# Patient Record
Sex: Female | Born: 2000 | Race: Black or African American | Hispanic: No | Marital: Single | Smoking: Never smoker
Health system: Southern US, Community
[De-identification: ages and names within clinical notes are randomized; demographics above are authoritative.]

## PROBLEM LIST (undated history)

## (undated) DIAGNOSIS — R519 Headache, unspecified: Secondary | ICD-10-CM

## (undated) DIAGNOSIS — R55 Syncope and collapse: Secondary | ICD-10-CM

## (undated) DIAGNOSIS — R51 Headache: Secondary | ICD-10-CM

## (undated) DIAGNOSIS — T783XXA Angioneurotic edema, initial encounter: Secondary | ICD-10-CM

## (undated) HISTORY — DX: Syncope and collapse: R55

## (undated) HISTORY — DX: Headache: R51

## (undated) HISTORY — DX: Headache, unspecified: R51.9

## (undated) HISTORY — PX: NO PAST SURGERIES: SHX2092

## (undated) HISTORY — DX: Angioneurotic edema, initial encounter: T78.3XXA

---
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
## 2022-07-29 DIAGNOSIS — Z3201 Encounter for pregnancy test, result positive: Secondary | ICD-10-CM | POA: Diagnosis not present

## 2022-07-29 DIAGNOSIS — Z8759 Personal history of other complications of pregnancy, childbirth and the puerperium: Secondary | ICD-10-CM | POA: Diagnosis not present

## 2022-07-31 DIAGNOSIS — Z3491 Encounter for supervision of normal pregnancy, unspecified, first trimester: Secondary | ICD-10-CM | POA: Diagnosis not present

## 2022-08-05 DIAGNOSIS — Z3491 Encounter for supervision of normal pregnancy, unspecified, first trimester: Secondary | ICD-10-CM | POA: Diagnosis not present

## 2022-08-05 DIAGNOSIS — Z3A12 12 weeks gestation of pregnancy: Secondary | ICD-10-CM | POA: Diagnosis not present

## 2022-09-02 DIAGNOSIS — Z3492 Encounter for supervision of normal pregnancy, unspecified, second trimester: Secondary | ICD-10-CM | POA: Diagnosis not present

## 2022-09-02 DIAGNOSIS — Z3A16 16 weeks gestation of pregnancy: Secondary | ICD-10-CM | POA: Diagnosis not present

## 2022-09-05 IMAGING — CR DG TOE 2ND 2+V*R*
3 series · 3 of 3 positions shown · non-contrast
Comparison: None.

CLINICAL DATA: Nodule along the plantar aspect of the right second
toe

EXAM:
RIGHT SECOND TOE

[toe ap]
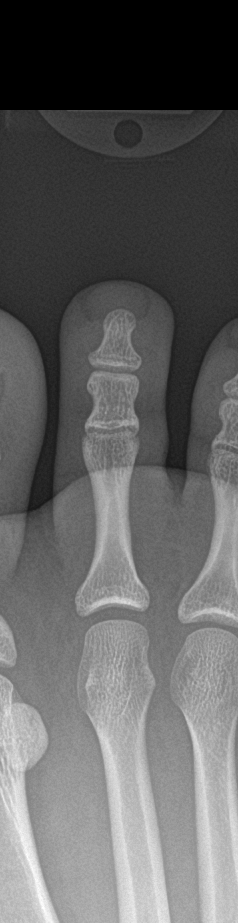

[toe obl]
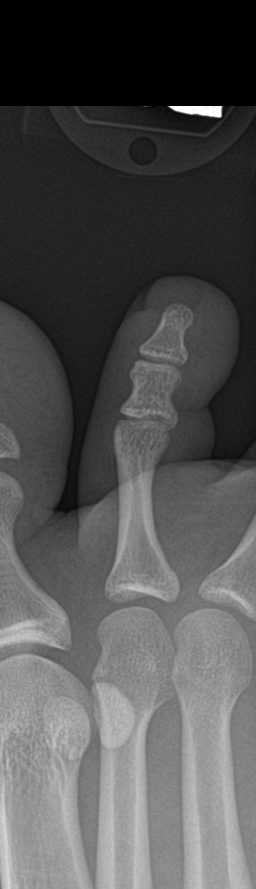

[toe lat]
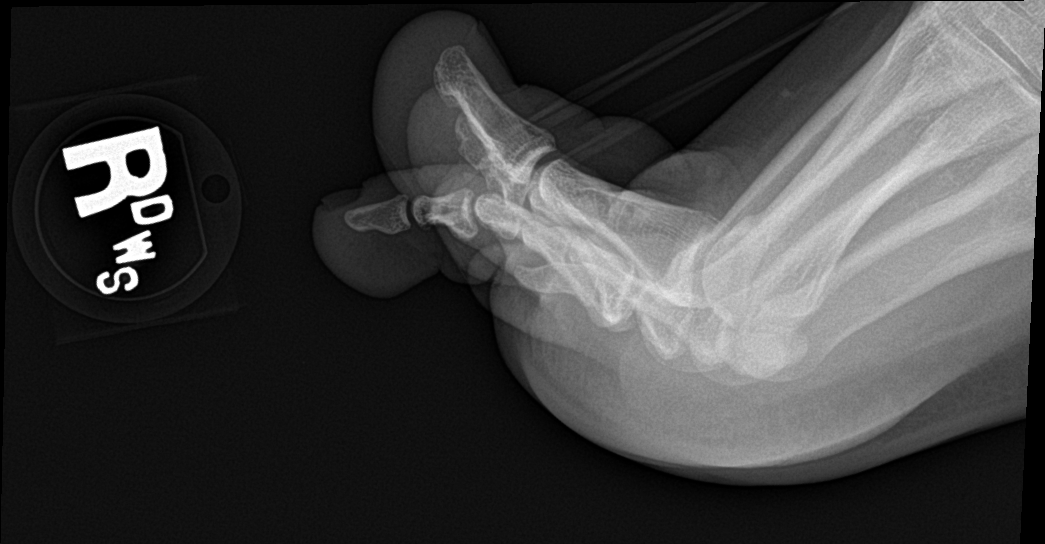

[3 of 3 positions shown; findings below may reference images not displayed]

FINDINGS: There is no evidence of fracture or dislocation. There is no
evidence of arthropathy or other focal bone abnormality. Soft
tissues are unremarkable.
IMPRESSION: Negative.

## 2022-09-30 DIAGNOSIS — Z3A20 20 weeks gestation of pregnancy: Secondary | ICD-10-CM | POA: Diagnosis not present

## 2022-09-30 DIAGNOSIS — O34219 Maternal care for unspecified type scar from previous cesarean delivery: Secondary | ICD-10-CM | POA: Diagnosis not present

## 2022-09-30 DIAGNOSIS — Z3689 Encounter for other specified antenatal screening: Secondary | ICD-10-CM | POA: Diagnosis not present

## 2022-09-30 DIAGNOSIS — Z8759 Personal history of other complications of pregnancy, childbirth and the puerperium: Secondary | ICD-10-CM | POA: Diagnosis not present

## 2022-11-05 DIAGNOSIS — Z3A26 26 weeks gestation of pregnancy: Secondary | ICD-10-CM | POA: Diagnosis not present

## 2022-11-05 DIAGNOSIS — O34211 Maternal care for low transverse scar from previous cesarean delivery: Secondary | ICD-10-CM | POA: Diagnosis not present

## 2022-11-05 DIAGNOSIS — O2612 Low weight gain in pregnancy, second trimester: Secondary | ICD-10-CM | POA: Diagnosis not present

## 2022-11-05 DIAGNOSIS — O09292 Supervision of pregnancy with other poor reproductive or obstetric history, second trimester: Secondary | ICD-10-CM | POA: Diagnosis not present

## 2022-11-20 DIAGNOSIS — Z362 Encounter for other antenatal screening follow-up: Secondary | ICD-10-CM | POA: Diagnosis not present

## 2022-11-20 DIAGNOSIS — Z3201 Encounter for pregnancy test, result positive: Secondary | ICD-10-CM | POA: Diagnosis not present

## 2022-11-20 DIAGNOSIS — Z3483 Encounter for supervision of other normal pregnancy, third trimester: Secondary | ICD-10-CM | POA: Diagnosis not present

## 2022-12-03 DIAGNOSIS — F411 Generalized anxiety disorder: Secondary | ICD-10-CM | POA: Diagnosis not present

## 2022-12-03 DIAGNOSIS — D649 Anemia, unspecified: Secondary | ICD-10-CM | POA: Diagnosis not present

## 2022-12-03 DIAGNOSIS — O09293 Supervision of pregnancy with other poor reproductive or obstetric history, third trimester: Secondary | ICD-10-CM | POA: Diagnosis not present

## 2022-12-03 DIAGNOSIS — O34211 Maternal care for low transverse scar from previous cesarean delivery: Secondary | ICD-10-CM | POA: Diagnosis not present

## 2022-12-03 DIAGNOSIS — Z3A30 30 weeks gestation of pregnancy: Secondary | ICD-10-CM | POA: Diagnosis not present

## 2022-12-03 DIAGNOSIS — O99013 Anemia complicating pregnancy, third trimester: Secondary | ICD-10-CM | POA: Diagnosis not present

## 2022-12-03 DIAGNOSIS — O99343 Other mental disorders complicating pregnancy, third trimester: Secondary | ICD-10-CM | POA: Diagnosis not present

## 2023-01-15 DIAGNOSIS — Z3493 Encounter for supervision of normal pregnancy, unspecified, third trimester: Secondary | ICD-10-CM | POA: Diagnosis not present

## 2023-01-15 DIAGNOSIS — Z3A36 36 weeks gestation of pregnancy: Secondary | ICD-10-CM | POA: Diagnosis not present

## 2023-01-21 DIAGNOSIS — O99013 Anemia complicating pregnancy, third trimester: Secondary | ICD-10-CM | POA: Diagnosis not present

## 2023-01-21 DIAGNOSIS — Z113 Encounter for screening for infections with a predominantly sexual mode of transmission: Secondary | ICD-10-CM | POA: Diagnosis not present

## 2023-02-12 DIAGNOSIS — O1494 Unspecified pre-eclampsia, complicating childbirth: Secondary | ICD-10-CM | POA: Diagnosis not present

## 2023-02-12 DIAGNOSIS — O9902 Anemia complicating childbirth: Secondary | ICD-10-CM | POA: Diagnosis not present

## 2023-02-12 DIAGNOSIS — O34219 Maternal care for unspecified type scar from previous cesarean delivery: Secondary | ICD-10-CM | POA: Diagnosis not present

## 2023-02-12 DIAGNOSIS — D649 Anemia, unspecified: Secondary | ICD-10-CM | POA: Diagnosis not present

## 2023-02-12 DIAGNOSIS — O480 Post-term pregnancy: Secondary | ICD-10-CM | POA: Diagnosis not present

## 2023-02-12 DIAGNOSIS — Z3A40 40 weeks gestation of pregnancy: Secondary | ICD-10-CM | POA: Diagnosis not present

## 2023-02-13 DIAGNOSIS — O34211 Maternal care for low transverse scar from previous cesarean delivery: Secondary | ICD-10-CM | POA: Diagnosis not present

## 2023-02-13 DIAGNOSIS — O1494 Unspecified pre-eclampsia, complicating childbirth: Secondary | ICD-10-CM | POA: Diagnosis not present

## 2023-02-13 DIAGNOSIS — D649 Anemia, unspecified: Secondary | ICD-10-CM | POA: Diagnosis not present

## 2023-02-13 DIAGNOSIS — O4202 Full-term premature rupture of membranes, onset of labor within 24 hours of rupture: Secondary | ICD-10-CM | POA: Diagnosis not present

## 2023-02-13 DIAGNOSIS — O9902 Anemia complicating childbirth: Secondary | ICD-10-CM | POA: Diagnosis not present

## 2023-02-13 DIAGNOSIS — Z3A40 40 weeks gestation of pregnancy: Secondary | ICD-10-CM | POA: Diagnosis not present

## 2023-02-14 DIAGNOSIS — O34211 Maternal care for low transverse scar from previous cesarean delivery: Secondary | ICD-10-CM | POA: Diagnosis not present

## 2023-02-15 DIAGNOSIS — D649 Anemia, unspecified: Secondary | ICD-10-CM | POA: Diagnosis not present

## 2023-02-15 DIAGNOSIS — O9903 Anemia complicating the puerperium: Secondary | ICD-10-CM | POA: Diagnosis not present

## 2023-02-15 DIAGNOSIS — O34211 Maternal care for low transverse scar from previous cesarean delivery: Secondary | ICD-10-CM | POA: Diagnosis not present

## 2023-02-18 DIAGNOSIS — N390 Urinary tract infection, site not specified: Secondary | ICD-10-CM | POA: Diagnosis not present

## 2023-02-18 DIAGNOSIS — R918 Other nonspecific abnormal finding of lung field: Secondary | ICD-10-CM | POA: Diagnosis not present

## 2023-02-19 DIAGNOSIS — R918 Other nonspecific abnormal finding of lung field: Secondary | ICD-10-CM | POA: Diagnosis not present

## 2023-02-21 DIAGNOSIS — O8600 Infection of obstetric surgical wound, unspecified: Secondary | ICD-10-CM | POA: Diagnosis not present

## 2023-02-21 DIAGNOSIS — R000 Tachycardia, unspecified: Secondary | ICD-10-CM | POA: Diagnosis not present

## 2023-02-21 DIAGNOSIS — L02211 Cutaneous abscess of abdominal wall: Secondary | ICD-10-CM | POA: Diagnosis not present

## 2023-02-23 ENCOUNTER — Other Ambulatory Visit: Payer: Self-pay

## 2023-02-23 ENCOUNTER — Emergency Department
Admission: EM | Admit: 2023-02-23 | Discharge: 2023-02-23 | Disposition: A | Discharge status: Home / Self Care | Payer: Medicaid Other | Attending: Emergency Medicine | Admitting: Emergency Medicine

## 2023-02-23 DIAGNOSIS — T8149XA Infection following a procedure, other surgical site, initial encounter: Secondary | ICD-10-CM | POA: Diagnosis not present

## 2023-02-23 DIAGNOSIS — Y838 Other surgical procedures as the cause of abnormal reaction of the patient, or of later complication, without mention of misadventure at the time of the procedure: Secondary | ICD-10-CM | POA: Diagnosis not present

## 2023-02-23 DIAGNOSIS — T8141XA Infection following a procedure, superficial incisional surgical site, initial encounter: Secondary | ICD-10-CM | POA: Insufficient documentation

## 2023-02-23 MED ORDER — MUPIROCIN 2 % EX OINT: 1.0000 | TOPICAL_OINTMENT | Freq: Two times a day (BID) | CUTANEOUS | 0 refills | Status: AC

## 2023-02-23 MED ORDER — CLINDAMYCIN HCL 150 MG PO CAPS: 450.0000 mg | ORAL_CAPSULE | Freq: Three times a day (TID) | ORAL | 0 refills | Status: AC

## 2023-02-23 NOTE — ED Notes (Signed)
See triage notes. Patient c/o an abscess to her C-section incision.

## 2023-02-23 NOTE — ED Triage Notes (Signed)
Pt here with a post op problem. Pt is 9 days post cesarean and has now developed an abscess on her incision. Pt states she was started on an abx as well for a recent UTI. Pt denies any other complaints.

## 2023-02-23 NOTE — ED Provider Notes (Signed)
[facility] Provider Note    Event Date/Time   First MD Initiated Contact with Patient 02/23/23 1201     (approximate)   History   Post-op Problem   HPI Christina Harding is a 22 y.o. female who is 10 days postop from C-section and presents for possible surgical site infection.  She has had an ongoing process over the last week where she has seen a couple of different providers in [place] with concerns for infection.  Most recently she was seen 2 days ago at an emergency department around [facility] where she and her mother who is at bedside state that she had an ultrasound which showed an abscess at the surgical site and the doctor was able to express a substantial amount of pus from the wound.  She was prescribed clindamycin and a topical antibiotic (and presumably mupirocin).  However, the prescriptions were sent electronically along with some Norco (which she said she did not even want), and the pharmacy was not able to fill the prescription over the weekend.  She is here because she is concerned that she is going too long without the antibiotics and she continues to have some drainage from the site.  No fever, no nausea, no vomiting.  She is breast-feeding.     Physical Exam   Triage Vital Signs: ED Triage Vitals  Encounter Vitals Group     BP 02/23/23 1151 112/71     Systolic BP Percentile --      Diastolic BP Percentile --      Pulse Rate 02/23/23 1151 69     Resp 02/23/23 1151 17     Temp 02/23/23 1151 97.9 F (36.6 C)     Temp Source 02/23/23 1151 Oral     SpO2 02/23/23 1151 96 %     Weight 02/23/23 1154 68.9 kg (151 lb 14.4 oz)     Height 02/23/23 1154 1.524 m (5')     Head Circumference --      Peak Flow --      Pain Score 02/23/23 1151 4     Pain Loc --      Pain Education --      Exclude from Growth Chart --     Most recent vital signs: Vitals:   02/23/23 1151  BP: 112/71  Pulse: 69  Resp: 17  Temp: 97.9 F (36.6 C)  SpO2: 96%     General: Awake, no distress.  Very well-appearing despite history of present illness. CV:  Good peripheral perfusion.  Regular rate and rhythm. Resp:  Normal effort. Speaking easily and comfortably, no accessory muscle usage nor intercostal retractions.   Abd:  No distention.  No tenderness to palpation of the abdomen.  Surprisingly well-appearing lower abdominal wound consistent with history of C-section.  No wound dehiscence and no discharge at this time.  There is 1 small area that appears raw, but it is not actively expressing fluid or pus.  There is induration along the length of the incision but no palpable fluctuance and no significant tenderness to palpation.   ED Results / Procedures / Treatments   Labs (all labs ordered are listed, but only abnormal results are displayed) Labs Reviewed - No data to display    PROCEDURES:  Critical Care performed: No  Procedures    IMPRESSION / MDM / ASSESSMENT AND PLAN / ED COURSE  I reviewed the triage vital signs and the nursing notes.  Differential diagnosis includes, but is not limited to, cutaneous abscess, intra-abdominal abscess, endometritis, phlegmon.  Patient's presentation is most consistent with acute presentation with potential threat to life or bodily function.   Despite the patient's probable surgical site abscess, she is very well-appearing, happy and communicative, in no distress.  Given what strong historians the patient and her mother are, there is no indication for further evaluation today.  I believe the history she is suggesting and she is not wanting anything other than the antibiotic she was prescribed 2 days ago but was unable to fill.  I will write her prescription for clindamycin as previously recommended by another outpatient provider as well as for mupirocin.  I reminded her about the clindamycin and the breast-feeding and encouraged her to speak with her pharmacist about this.   She will follow-up with her OB/GYN as scheduled next week and I gave my usual and customary return precautions.         FINAL CLINICAL IMPRESSION(S) / ED DIAGNOSES   Final diagnoses:  Infection of superficial incisional surgical site after procedure, initial encounter     Rx / DC Orders   ED Discharge Orders          Ordered    clindamycin (CLEOCIN) 150 MG capsule  3 times daily        02/23/23 1330    mupirocin ointment (BACTROBAN) 2 %  2 times daily        02/23/23 1330             Note:  This document was prepared using Dragon voice recognition software and may include unintentional dictation errors.   Loleta Rose, MD 02/23/23 1330

## 2023-02-23 NOTE — Discharge Instructions (Signed)
Please take the full course of antibiotics.  We recommend you talk with the pharmacist about taking this medication while breast-feeding.  Return to the emergency department if you develop new or worsening symptoms that concern you.

## 2023-03-28 DIAGNOSIS — L819 Disorder of pigmentation, unspecified: Secondary | ICD-10-CM | POA: Diagnosis not present

## 2023-03-28 DIAGNOSIS — Z30011 Encounter for initial prescription of contraceptive pills: Secondary | ICD-10-CM | POA: Diagnosis not present

## 2023-06-23 DIAGNOSIS — Z1152 Encounter for screening for COVID-19: Secondary | ICD-10-CM | POA: Diagnosis not present

## 2023-06-23 DIAGNOSIS — Z20822 Contact with and (suspected) exposure to covid-19: Secondary | ICD-10-CM | POA: Diagnosis not present

## 2023-06-23 DIAGNOSIS — R519 Headache, unspecified: Secondary | ICD-10-CM | POA: Diagnosis not present

## 2024-05-25 ENCOUNTER — Other Ambulatory Visit: Payer: Self-pay

## 2024-05-25 ENCOUNTER — Emergency Department

## 2024-05-25 ENCOUNTER — Emergency Department
Admission: EM | Admit: 2024-05-25 | Discharge: 2024-05-25 | Disposition: A | Discharge status: Home / Self Care | Attending: Emergency Medicine | Admitting: Emergency Medicine

## 2024-05-25 DIAGNOSIS — Y9389 Activity, other specified: Secondary | ICD-10-CM | POA: Insufficient documentation

## 2024-05-25 DIAGNOSIS — M25561 Pain in right knee: Secondary | ICD-10-CM | POA: Insufficient documentation

## 2024-05-25 DIAGNOSIS — W1839XA Other fall on same level, initial encounter: Secondary | ICD-10-CM | POA: Diagnosis not present

## 2024-05-25 NOTE — ED Triage Notes (Signed)
 Per patient's report, patient fell on right knee cap while playing with her children on a wooden floor two days ago. Swelling noted distal to right knee. Pain increases with ambulation.

## 2024-05-25 NOTE — ED Provider Notes (Signed)
 "  [facility] Provider Note    Event Date/Time   First MD Initiated Contact with Patient 05/25/24 1055     (approximate)   History   Knee Pain   HPI  Christina Harding is a 24 y.o. female with no reported past medical history presents today for evaluation of right she was playing with her kids and fell onto her knee onto the hard floor 2 days ago.  She has been able to ambulate but has had pain with ambulation.  No other injuries.  No numbness or tingling.  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   Transient alteration of awareness 05/19/2017          Physical Exam   Triage Vital Signs: ED Triage Vitals  Encounter Vitals Group     BP 05/25/24 1056 (!) 116/58     Girls Systolic BP Percentile --      Girls Diastolic BP Percentile --      Boys Systolic BP Percentile --      Boys Diastolic BP Percentile --      Pulse Rate 05/25/24 1056 77     Resp 05/25/24 1056 16     Temp 05/25/24 1056 98.7 F (37.1 C)     Temp Source 05/25/24 1056 Oral     SpO2 05/25/24 1056 100 %     Weight 05/25/24 1055 170 lb (77.1 kg)     Height 05/25/24 1055 5' (1.524 m)     Head Circumference --      Peak Flow --      Pain Score 05/25/24 1055 8     Pain Loc --      Pain Education --      Exclude from Growth Chart --     Most recent vital signs: Vitals:   05/25/24 1056  BP: (!) 116/58  Pulse: 77  Resp: 16  Temp: 98.7 F (37.1 C)  SpO2: 100%    Physical Exam Vitals and nursing note reviewed.  Constitutional:      General: Awake and alert. No acute distress.    Appearance: Normal appearance. The patient is normal weight.  HENT:     Head: Normocephalic and atraumatic.     Mouth: Mucous membranes are moist.  Eyes:     General: PERRL. Normal EOMs        Right eye: No discharge.        Left eye: No discharge.      Conjunctiva/sclera: Conjunctivae normal.  Cardiovascular:     Rate and Rhythm: Normal rate and regular rhythm.     Pulses: Normal pulses.  Pulmonary:     Effort: Pulmonary effort is normal. No respiratory distress.     Breath sounds: Normal breath sounds.  Abdominal:     Abdomen is soft. There is no abdominal tenderness. No rebound or guarding. No distention. Musculoskeletal:        General: No swelling. Normal range of motion.     Cervical back: Normal range of motion and neck supple.  Right knee: No deformity or rash. Inferio-lateral joint line tenderness. No patellar tenderness, no ballotment Warm and well perfused extremity with 2+ pedal pulses 5/5 strength to dorsiflexion and plantarflexion at the ankle with intact sensation throughout extremity Normal range of motion of the knee, with intact flexion and extension to active and passive range of motion.  Extensor mechanism intact. No ligamentous laxity. Negative anterior/posterior drawer/negative lachman, negative mcmurrays No effusion or warmth Intact quadriceps, hamstring function, patellar tendon function Pelvis stable Full ROM of ankle without pain or swelling Foot warm and well perfused Skin:    General: Skin is warm and dry.     Capillary Refill: Capillary refill takes less than 2 seconds.     Findings: No rash.  Neurological:     Mental Status: The patient is awake and alert.      ED Results / Procedures / Treatments   Labs (all labs ordered are listed, but only abnormal results are displayed) Labs Reviewed - No data to display   EKG     RADIOLOGY I independently reviewed and interpreted imaging and agree with radiologists findings.     PROCEDURES:  Critical Care performed:   Procedures   MEDICATIONS ORDERED IN ED: Medications - No data to display   IMPRESSION / MDM / ASSESSMENT AND PLAN / ED COURSE  I reviewed the triage vital signs and the nursing notes.   Differential diagnosis includes,  but is not limited to, knee contusion, ligamental injury, less likely fracture.  Patient is awake and alert, hemodynamically stable and afebrile.  No evidence of neurological deficit or vascular compromise on exam.  She has full active and passive range of motion on exam.  No fracture/dislocation on X-Ray. No deformity or obvious ligamentous laxity on exam. No constitutional symptoms or effusion to suggest septic joint. No history of immunosuppression. Overall well appearing, vital signs stable.  History and exam and radiographical imaging are most consistent with contusion.  She was given Ace wrap for extra support.  Advised to remove it at night to not increase her risk for developing a blood clot.  Return precautions and care instructions discussed. Outpatient follow-up advised. Patient agrees with plan of care.   Patient's presentation is most consistent with acute complicated illness / injury requiring diagnostic workup.       FINAL CLINICAL IMPRESSION(S) / ED DIAGNOSES   Final diagnoses:  Acute pain of right knee     Rx / DC Orders   ED Discharge Orders     None        Note:  This document was prepared using Dragon voice recognition software and may include unintentional dictation errors.   Evangaline Jou E, PA-C 05/25/24 1312    Bradler, Evan K, MD 05/25/24 [phone]  "

## 2024-05-25 NOTE — Discharge Instructions (Addendum)
 Your x-ray was normal.  You may wear the Ace wrap during the day, remove it at night.  Rest, ice, and elevate your knee. Return for new, worsening, or change in symptoms or other concerns.
